# Patient Record
Sex: Female | Born: 1960 | Race: White | Hispanic: No | Smoking: Never smoker
Health system: Southern US, Community
[De-identification: ages and names within clinical notes are randomized; demographics above are authoritative.]

## PROBLEM LIST (undated history)

## (undated) DIAGNOSIS — G4700 Insomnia, unspecified: Secondary | ICD-10-CM

## (undated) DIAGNOSIS — R5383 Other fatigue: Secondary | ICD-10-CM

---

## 2009-03-07 ENCOUNTER — Emergency Department: Payer: Self-pay | Admitting: Emergency Medicine

## 2010-01-02 ENCOUNTER — Ambulatory Visit: Payer: Self-pay

## 2010-03-05 ENCOUNTER — Ambulatory Visit: Payer: Self-pay | Admitting: Internal Medicine

## 2010-03-14 ENCOUNTER — Ambulatory Visit: Payer: Self-pay | Admitting: Internal Medicine

## 2011-05-22 ENCOUNTER — Ambulatory Visit: Payer: Self-pay | Admitting: Family

## 2012-07-08 ENCOUNTER — Ambulatory Visit: Payer: Self-pay | Admitting: Family

## 2012-07-14 ENCOUNTER — Ambulatory Visit: Payer: Self-pay

## 2014-07-09 ENCOUNTER — Encounter: Payer: Self-pay | Admitting: *Deleted

## 2015-03-31 ENCOUNTER — Encounter: Payer: Self-pay | Admitting: *Deleted

## 2015-04-01 ENCOUNTER — Ambulatory Visit: Payer: No Typology Code available for payment source | Admitting: Anesthesiology

## 2015-04-01 ENCOUNTER — Encounter
Admission: RE | Disposition: A | Discharge status: Home / Self Care | Payer: Self-pay | Source: Ambulatory Visit | Attending: Gastroenterology

## 2015-04-01 ENCOUNTER — Ambulatory Visit
Admission: RE | Admit: 2015-04-01 | Discharge: 2015-04-01 | Disposition: A | Discharge status: Home / Self Care | Payer: No Typology Code available for payment source | Source: Ambulatory Visit | Attending: Gastroenterology | Admitting: Gastroenterology

## 2015-04-01 ENCOUNTER — Encounter: Payer: Self-pay | Admitting: *Deleted

## 2015-04-01 DIAGNOSIS — G4700 Insomnia, unspecified: Secondary | ICD-10-CM | POA: Diagnosis not present

## 2015-04-01 DIAGNOSIS — Z1211 Encounter for screening for malignant neoplasm of colon: Secondary | ICD-10-CM | POA: Insufficient documentation

## 2015-04-01 DIAGNOSIS — K635 Polyp of colon: Secondary | ICD-10-CM | POA: Insufficient documentation

## 2015-04-01 DIAGNOSIS — Z880 Allergy status to penicillin: Secondary | ICD-10-CM | POA: Insufficient documentation

## 2015-04-01 HISTORY — DX: Other fatigue: R53.83

## 2015-04-01 HISTORY — DX: Insomnia, unspecified: G47.00

## 2015-04-01 HISTORY — PX: COLONOSCOPY WITH PROPOFOL: SHX5780

## 2015-04-01 SURGERY — COLONOSCOPY WITH PROPOFOL
Anesthesia: General

## 2015-04-01 MED ORDER — SODIUM CHLORIDE 0.9 % IV SOLN
INTRAVENOUS | Status: DC
  Administered 2015-04-01: 13:00:00 via INTRAVENOUS

## 2015-04-01 MED ORDER — FENTANYL CITRATE (PF) 100 MCG/2ML IJ SOLN
INTRAMUSCULAR | Status: DC | PRN
  Administered 2015-04-01 (×2): 50 ug via INTRAVENOUS

## 2015-04-01 MED ORDER — PHENYLEPHRINE HCL 10 MG/ML IJ SOLN
INTRAMUSCULAR | Status: DC | PRN
  Administered 2015-04-01 (×3): 100 ug via INTRAVENOUS

## 2015-04-01 MED ORDER — MIDAZOLAM HCL 2 MG/2ML IJ SOLN
INTRAMUSCULAR | Status: DC | PRN
  Administered 2015-04-01: 2 mg via INTRAVENOUS

## 2015-04-01 MED ORDER — SODIUM CHLORIDE 0.9 % IV SOLN
INTRAVENOUS | Status: DC
  Administered 2015-04-01: 14:00:00 via INTRAVENOUS

## 2015-04-01 MED ORDER — PROPOFOL 10 MG/ML IV BOLUS
INTRAVENOUS | Status: DC | PRN
  Administered 2015-04-01: 100 mg via INTRAVENOUS

## 2015-04-01 MED ORDER — PROPOFOL 500 MG/50ML IV EMUL
INTRAVENOUS | Status: DC | PRN
Start: 2015-04-01 — End: 2015-04-01
  Administered 2015-04-01: 80 ug/kg/min via INTRAVENOUS

## 2015-04-01 NOTE — Transfer of Care (Signed)
Immediate Anesthesia Transfer of Care Note  Patient: Olivia Carlson  Procedure(s) Performed: Procedure(s): COLONOSCOPY WITH PROPOFOL (N/A)  Patient Location: PACU  Anesthesia Type:General  Level of Consciousness: awake, alert  and oriented  Airway & Oxygen Therapy: Patient Spontanous Breathing and Patient connected to nasal cannula oxygen  Post-op Assessment: Report given to RN and Post -op Vital signs reviewed and stable  Post vital signs: Reviewed and stable  Last Vitals:  Filed Vitals:   04/01/15 1305 04/01/15 1431  BP: 109/70   Pulse: 77 70  Temp: 37 C   Resp: 12 19    Complications: No apparent anesthesia complications

## 2015-04-01 NOTE — Op Note (Signed)
[facility] Gastroenterology Patient Name: Olivia RobinsonKaren Carlson Procedure Date: 04/01/2015 2:01 PM MRN: [id number] Account #: [id number] Date of Birth: 01/24/1961 Admit Type: Outpatient Age: [age] [facility]OM 1 Gender: Female Note Status: Finalized Procedure:         Colonoscopy Indications:       Screening for colorectal malignant neoplasm, This is the                     patient's first colonoscopy Patient Profile:   This is a 54 year old female. Providers:         Rhona RaiderMatthew G. Shelle Ironein, MD Referring MD:      [place] DragonKrista Lindley PA Medicines:         Propofol per Anesthesia Complications:     No immediate complications. Procedure:         Pre-Anesthesia Assessment:                    - Prior to the procedure, a History and Physical was                     performed, and patient medications, allergies and                     sensitivities were reviewed. The patient's tolerance of                     previous anesthesia was reviewed.                    After obtaining informed consent, the colonoscope was                     passed under direct vision. Throughout the procedure, the                     patient's blood pressure, pulse, and oxygen saturations                     were monitored continuously. The Olympus PCF-H180AL                     colonoscope ( S#: [id number] ) was introduced through the                     anus and advanced to the the terminal ileum. The                     colonoscopy was performed without difficulty. The patient                     tolerated the procedure well. The quality of the bowel                     preparation was excellent. Findings:      The perianal and digital rectal examinations were normal.      A 7 mm polyp was found in the mid transverse colon. The polyp was flat.       The polyp was removed with a cold snare. Resection and retrieval were       complete.      The exam was otherwise without abnormality on direct and  retroflexion       views. Impression:        - One 7 mm polyp in the mid transverse  colon. Resected and                     retrieved.                    - The examination was otherwise normal on direct and                     retroflexion views. Recommendation:    - Observe patient in GI recovery unit.                    - High fiber diet.                    - Continue present medications.                    - Await pathology results.                    - Repeat colonoscopy for surveillance based on pathology                     results.                    - Return to referring physician.                    - The findings and recommendations were discussed with the                     patient.                    - The findings and recommendations were discussed with the                     patient's family. Procedure Code(s): --- Professional ---                    [phone], Colonoscopy, flexible; with removal of tumor(s),                     polyp(s), or other lesion(s) by snare technique Diagnosis Code(s): --- Professional ---                    Z12.11, Encounter for screening for malignant neoplasm of                     colon                    D12.3, Benign neoplasm of transverse colon CPT copyright 2014 [employer]. All rights reserved. The codes documented in this report are preliminary and upon coder review may  be revised to meet current compliance requirements. Kathalene Frames, MD 04/01/2015 2:31:13 PM This report has been signed electronically. Number of Addenda: 0 Note Initiated On: 04/01/2015 2:01 PM Scope Withdrawal Time: 0 hours 14 minutes 58 seconds  Total Procedure Duration: 0 hours 21 minutes 24 seconds       [facility]

## 2015-04-01 NOTE — H&P (Signed)
  Primary Care Physician:  Rich NumberLINDLEY, KRYSTA, PA-C  Pre-Procedure History & Physical: HPI:  Olivia Carlson is a 54 y.o. female is here for an colonoscopy.   Past Medical History  Diagnosis Date  . Insomnia   . Fatigue     History reviewed. No pertinent past surgical history.  Prior to Admission medications   Not on File    Allergies as of 03/02/2015  . (Not on File)    History reviewed. No pertinent family history.  Social History   Social History  . Marital Status: Divorced    Spouse Name: N/A  . Number of Children: N/A  . Years of Education: N/A   Occupational History  . Not on file.   Social History Main Topics  . Smoking status: Never Smoker   . Smokeless tobacco: Never Used  . Alcohol Use: Yes  . Drug Use: No  . Sexual Activity: Not on file   Other Topics Concern  . Not on file   Social History Narrative     Physical Exam: BP 109/70 mmHg  Pulse 77  Temp(Src) 98.6 F (37 C) (Tympanic)  Resp 12  Ht 5\' 7"  (1.702 m)  Wt 72.576 kg (160 lb)  BMI 25.05 kg/m2  SpO2 99% General:   Alert,  pleasant and cooperative in NAD Head:  Normocephalic and atraumatic. Neck:  Supple; no masses or thyromegaly. Lungs:  Clear throughout to auscultation.    Heart:  Regular rate and rhythm. Abdomen:  Soft, nontender and nondistended. Normal bowel sounds, without guarding, and without rebound.   Neurologic:  Alert and  oriented x4;  grossly normal neurologically.  Impression/Plan: Olivia Carlson is here for an colonoscopy to be performed for screening  Risks, benefits, limitations, and alternatives regarding  colonoscopy have been reviewed with the patient.  Questions have been answered.  All parties agreeable.   Elnita MaxwellEIN, Mataya Kilduff GORDON, MD  04/01/2015, 1:56 PM

## 2015-04-01 NOTE — Anesthesia Preprocedure Evaluation (Signed)
Anesthesia Evaluation  Patient identified by MRN, date of birth, ID band Patient awake    Reviewed: Allergy & Precautions, H&P , NPO status , Patient's Chart, lab work & pertinent test results  Airway Mallampati: III  TM Distance: >3 FB Neck ROM: full    Dental no notable dental hx. (+) Teeth Intact   Pulmonary neg pulmonary ROS, neg shortness of breath,    Pulmonary exam normal breath sounds clear to auscultation       Cardiovascular Exercise Tolerance: Good (-) angina(-) Past MI and (-) DOE negative cardio ROS Normal cardiovascular exam Rhythm:regular Rate:Normal     Neuro/Psych negative neurological ROS  negative psych ROS   GI/Hepatic negative GI ROS, Neg liver ROS, neg GERD  ,  Endo/Other  negative endocrine ROS  Renal/GU negative Renal ROS  negative genitourinary   Musculoskeletal   Abdominal   Peds  Hematology negative hematology ROS (+)   Anesthesia Other Findings Past Medical History:   Insomnia                                                     Fatigue                                                     History reviewed. No pertinent surgical history.  BMI    Body Mass Index   25.05 kg/m 2      Reproductive/Obstetrics negative OB ROS                             Anesthesia Physical Anesthesia Plan  ASA: II  Anesthesia Plan: General   Post-op Pain Management:    Induction:   Airway Management Planned:   Additional Equipment:   Intra-op Plan:   Post-operative Plan:   Informed Consent: I have reviewed the patients History and Physical, chart, labs and discussed the procedure including the risks, benefits and alternatives for the proposed anesthesia with the patient or authorized representative who has indicated his/her understanding and acceptance.   Dental Advisory Given  Plan Discussed with: Anesthesiologist, CRNA and Surgeon  Anesthesia Plan Comments:          Anesthesia Quick Evaluation

## 2015-04-03 NOTE — Anesthesia Postprocedure Evaluation (Signed)
Anesthesia Post Note  Patient: Olivia RoseKaren E Vasey  Procedure(s) Performed: Procedure(s) (LRB): COLONOSCOPY WITH PROPOFOL (N/A)  Patient location during evaluation: PACU Anesthesia Type: General Level of consciousness: awake and alert Pain management: pain level controlled Vital Signs Assessment: post-procedure vital signs reviewed and stable Respiratory status: spontaneous breathing, nonlabored ventilation, respiratory function stable and patient connected to nasal cannula oxygen Cardiovascular status: blood pressure returned to baseline and stable Postop Assessment: no signs of nausea or vomiting Anesthetic complications: no    Last Vitals:  Filed Vitals:   04/01/15 1450 04/01/15 1500  BP: 104/65 100/59  Pulse: 72 70  Temp:    Resp: 18 15    Last Pain: There were no vitals filed for this visit.               Yevette EdwardsJames G Adams

## 2015-04-06 ENCOUNTER — Encounter: Payer: Self-pay | Admitting: Gastroenterology

## 2015-04-06 LAB — SURGICAL PATHOLOGY

## 2016-05-22 ENCOUNTER — Other Ambulatory Visit: Payer: Self-pay | Admitting: Nurse Practitioner

## 2016-05-22 DIAGNOSIS — Z1231 Encounter for screening mammogram for malignant neoplasm of breast: Secondary | ICD-10-CM

## 2016-05-24 ENCOUNTER — Other Ambulatory Visit: Payer: Self-pay | Admitting: Nurse Practitioner

## 2016-05-24 ENCOUNTER — Ambulatory Visit
Admission: RE | Admit: 2016-05-24 | Discharge: 2016-05-24 | Disposition: A | Discharge status: Home / Self Care | Payer: Managed Care, Other (non HMO) | Source: Ambulatory Visit | Attending: Nurse Practitioner | Admitting: Nurse Practitioner

## 2016-05-24 DIAGNOSIS — Z1231 Encounter for screening mammogram for malignant neoplasm of breast: Secondary | ICD-10-CM | POA: Diagnosis present

## 2016-05-24 DIAGNOSIS — N6489 Other specified disorders of breast: Secondary | ICD-10-CM | POA: Diagnosis not present

## 2017-09-05 ENCOUNTER — Other Ambulatory Visit: Payer: Self-pay | Admitting: Registered"

## 2017-09-05 DIAGNOSIS — Z1231 Encounter for screening mammogram for malignant neoplasm of breast: Secondary | ICD-10-CM

## 2017-09-18 ENCOUNTER — Ambulatory Visit
Admission: RE | Admit: 2017-09-18 | Discharge: 2017-09-18 | Disposition: A | Discharge status: Home / Self Care | Payer: Managed Care, Other (non HMO) | Source: Ambulatory Visit | Attending: Registered" | Admitting: Registered"

## 2017-09-18 DIAGNOSIS — Z1231 Encounter for screening mammogram for malignant neoplasm of breast: Secondary | ICD-10-CM | POA: Diagnosis present

## 2020-04-20 ENCOUNTER — Other Ambulatory Visit: Payer: Self-pay | Admitting: Obstetrics and Gynecology

## 2020-04-20 DIAGNOSIS — Z1231 Encounter for screening mammogram for malignant neoplasm of breast: Secondary | ICD-10-CM

## 2020-06-08 ENCOUNTER — Other Ambulatory Visit: Payer: Self-pay

## 2020-06-08 ENCOUNTER — Ambulatory Visit
Admission: RE | Admit: 2020-06-08 | Discharge: 2020-06-08 | Disposition: A | Discharge status: Home / Self Care | Payer: No Typology Code available for payment source | Source: Ambulatory Visit | Attending: Obstetrics and Gynecology | Admitting: Obstetrics and Gynecology

## 2020-06-08 DIAGNOSIS — Z1231 Encounter for screening mammogram for malignant neoplasm of breast: Secondary | ICD-10-CM | POA: Diagnosis not present

## 2021-06-06 ENCOUNTER — Other Ambulatory Visit: Payer: Self-pay | Admitting: Obstetrics and Gynecology

## 2021-06-06 DIAGNOSIS — Z1231 Encounter for screening mammogram for malignant neoplasm of breast: Secondary | ICD-10-CM

## 2021-07-23 IMAGING — MG MM DIGITAL SCREENING BILAT W/ TOMO AND CAD
8 series · 8 of 24 positions shown · non-contrast
Comparison: Previous exam(s).

CLINICAL DATA: Screening.

EXAM:
DIGITAL SCREENING BILATERAL MAMMOGRAM WITH TOMOSYNTHESIS AND CAD
TECHNIQUE: Bilateral screening digital craniocaudal and mediolateral oblique
mammograms were obtained. Bilateral screening digital breast
tomosynthesis was performed. The images were evaluated with
computer-aided detection.

[R CC synth-2D]
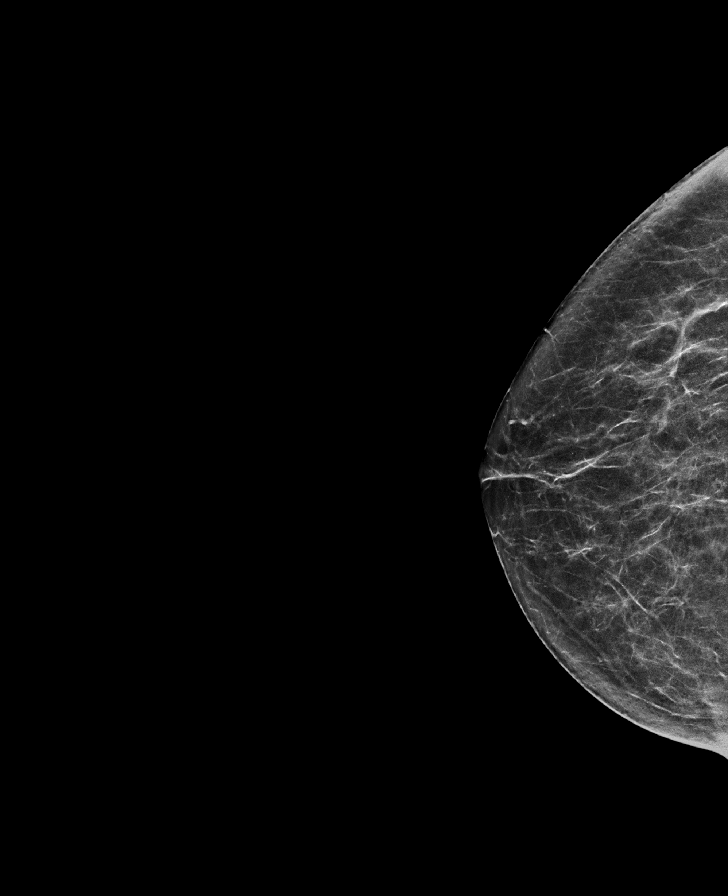

[L CC synth-2D]
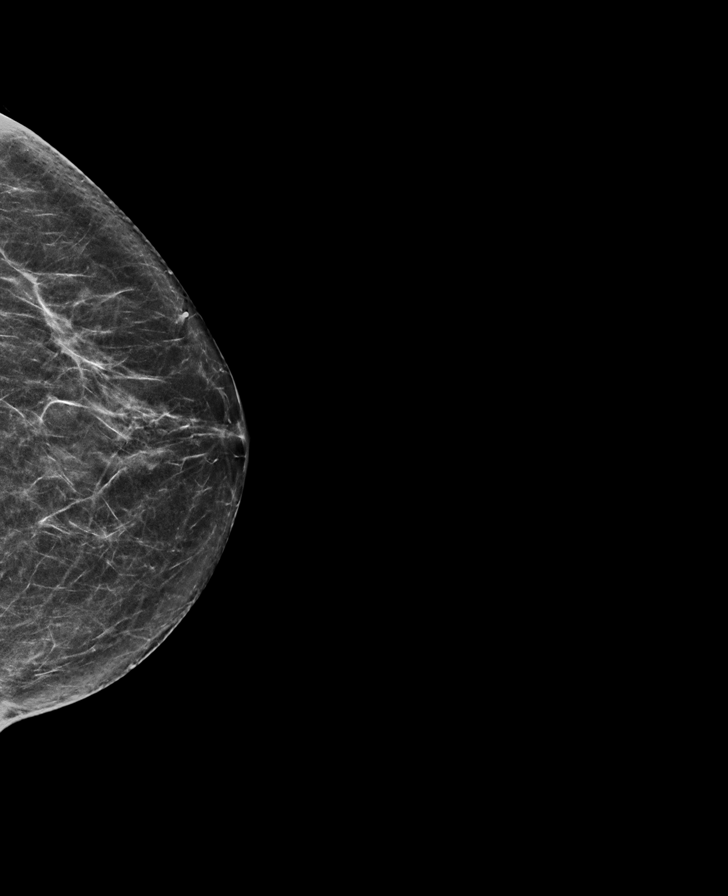

[L MLO synth-2D]
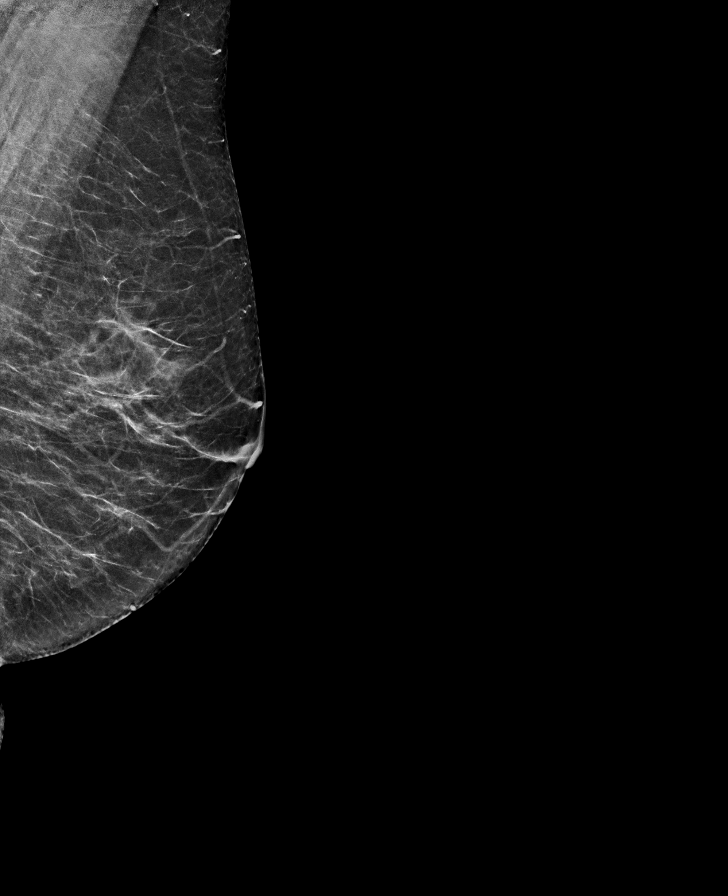

[R MLO synth-2D]
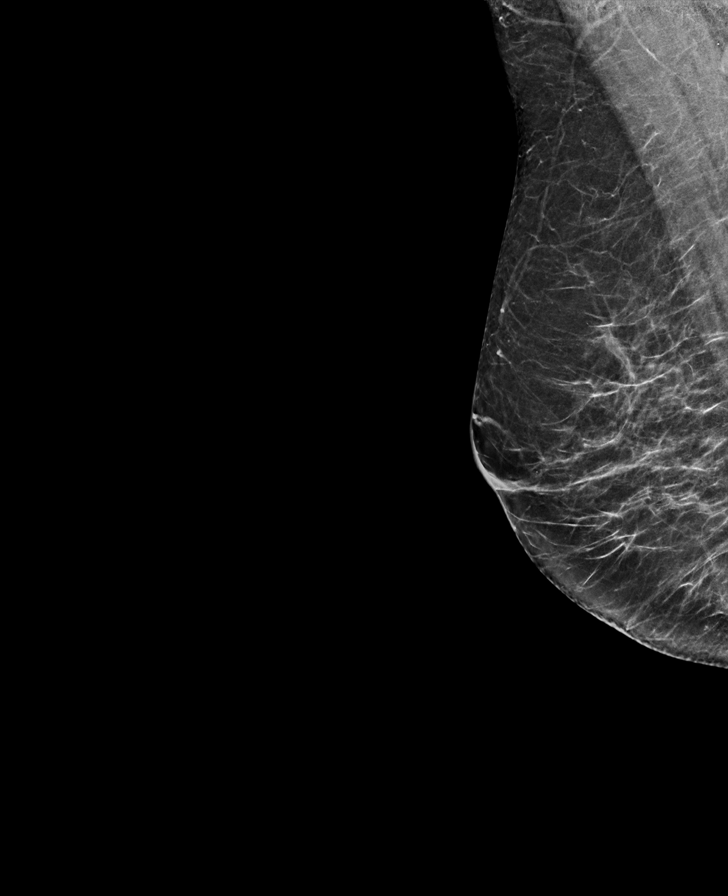

[L MLO tomo · tomo slice 32/63.0]
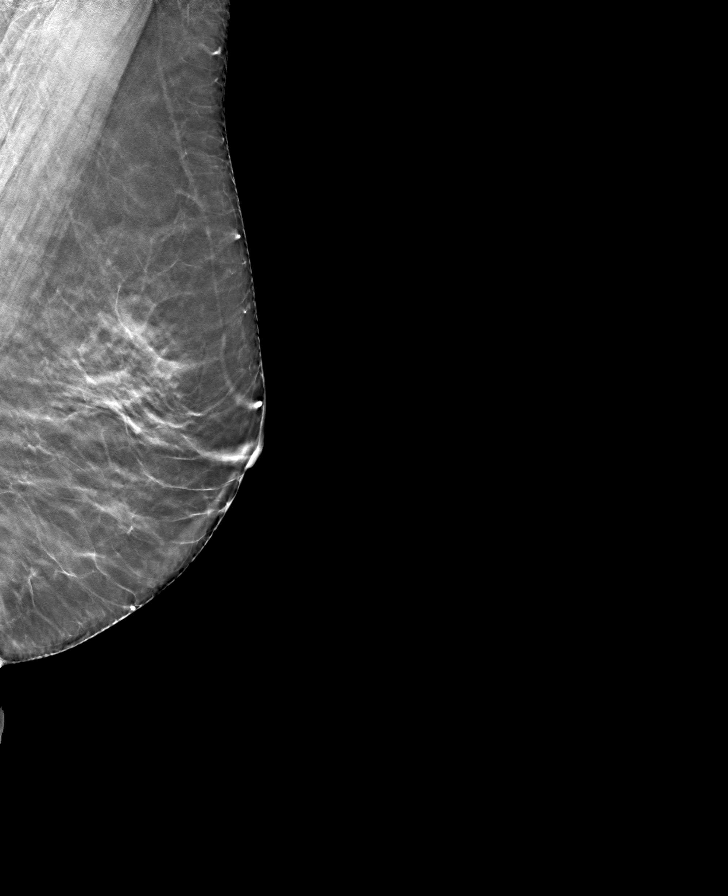

[R MLO tomo · tomo slice 32/63.0]
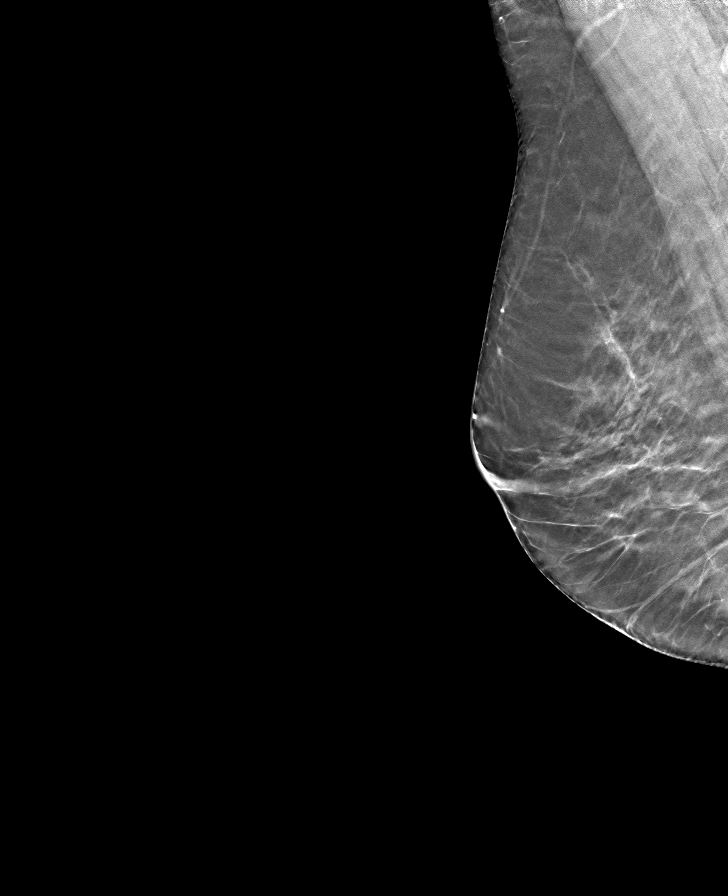

[L CC tomo · tomo slice 32/63.0]
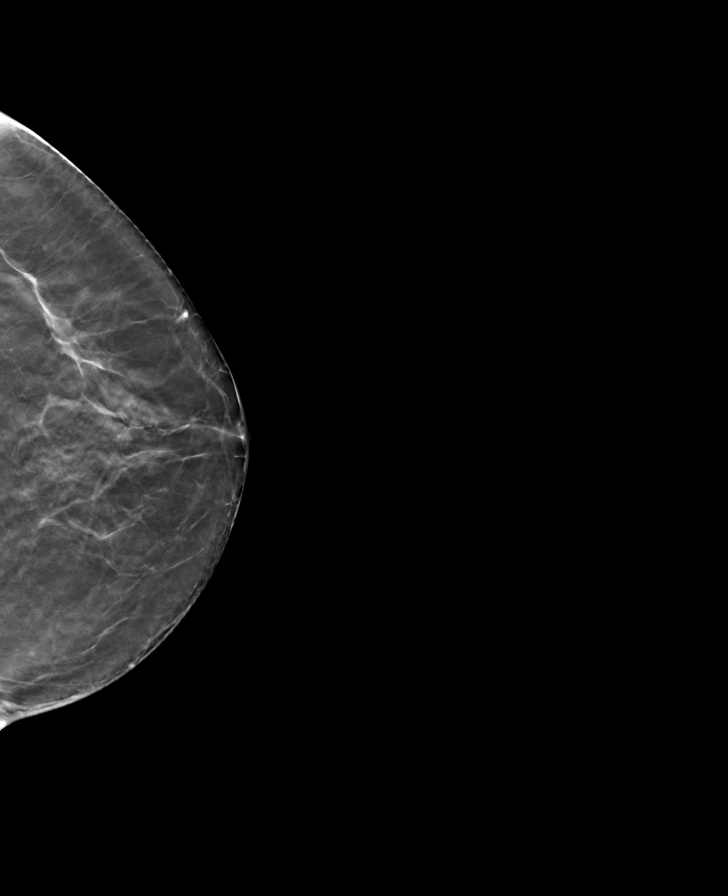

[R CC tomo · tomo slice 32/63.0]
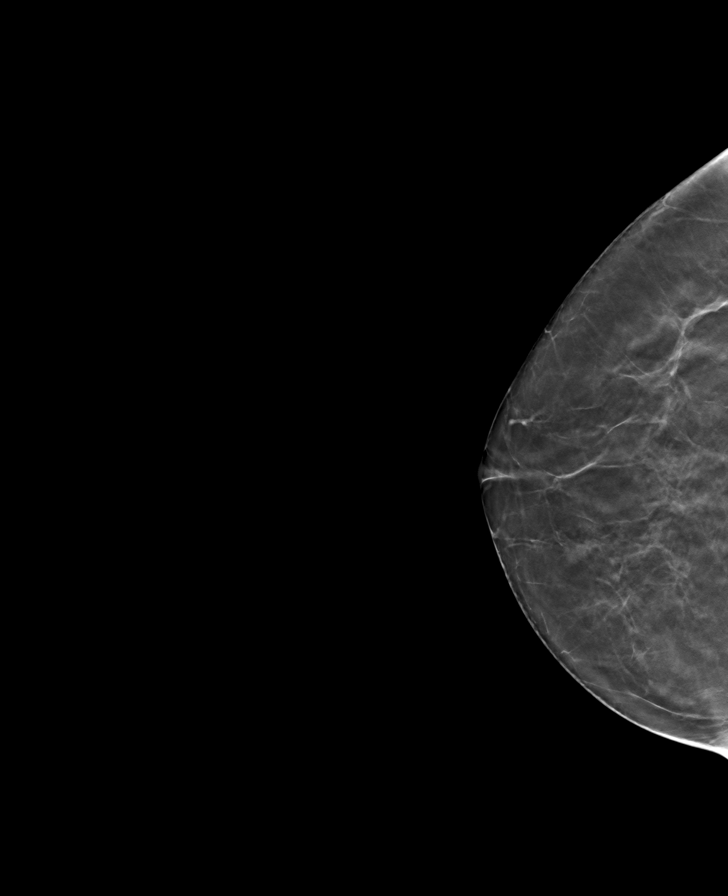

[8 of 24 positions shown; findings below may reference images not displayed]

ACR Breast Density Category b: There are scattered areas of
fibroglandular density.
FINDINGS: There are no findings suspicious for malignancy. The images were
evaluated with computer-aided detection.
IMPRESSION: No mammographic evidence of malignancy. A result letter of this
screening mammogram will be mailed directly to the patient.

RECOMMENDATION:
Screening mammogram in one year. (Code:WJ-I-BG6)

BI-RADS CATEGORY  1: Negative.

## 2023-09-23 DIAGNOSIS — Z01419 Encounter for gynecological examination (general) (routine) without abnormal findings: Secondary | ICD-10-CM | POA: Diagnosis not present

## 2023-09-23 DIAGNOSIS — Z6823 Body mass index (BMI) 23.0-23.9, adult: Secondary | ICD-10-CM | POA: Diagnosis not present

## 2024-01-06 ENCOUNTER — Other Ambulatory Visit: Payer: Self-pay | Admitting: Obstetrics and Gynecology

## 2024-01-06 DIAGNOSIS — Z1231 Encounter for screening mammogram for malignant neoplasm of breast: Secondary | ICD-10-CM
